# Patient Record
Sex: Male | Born: 1990 | Race: Black or African American | Hispanic: No | Marital: Single
Health system: Southern US, Community
[De-identification: ages and names within clinical notes are randomized; demographics above are authoritative.]

---
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
## 2010-01-28 IMAGING — CR DG ANKLE COMPLETE 3+V*L*
3 series · 3 of 3 positions shown · non-contrast
Comparison: None

CLINICAL DATA: Twisted ankle - pain and swelling

LEFT ANKLE COMPLETE - 3+ VIEW

[t ankle joint ap left]
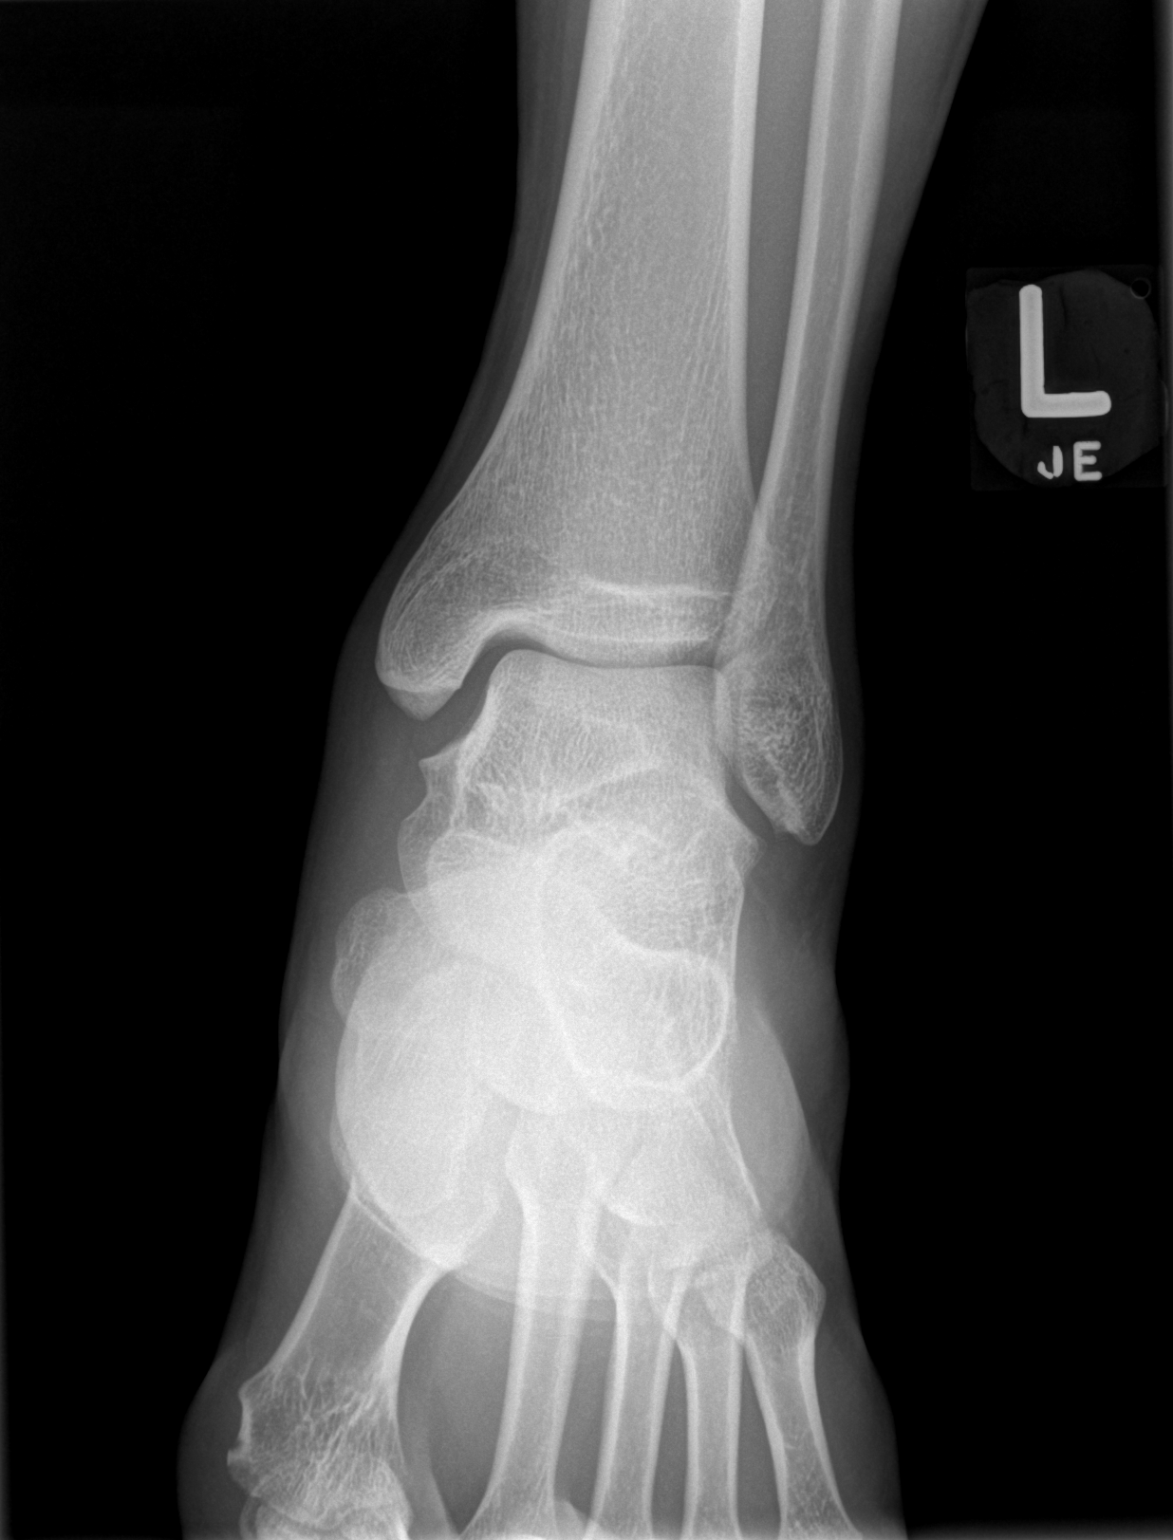

[t ankle joint oblique left]
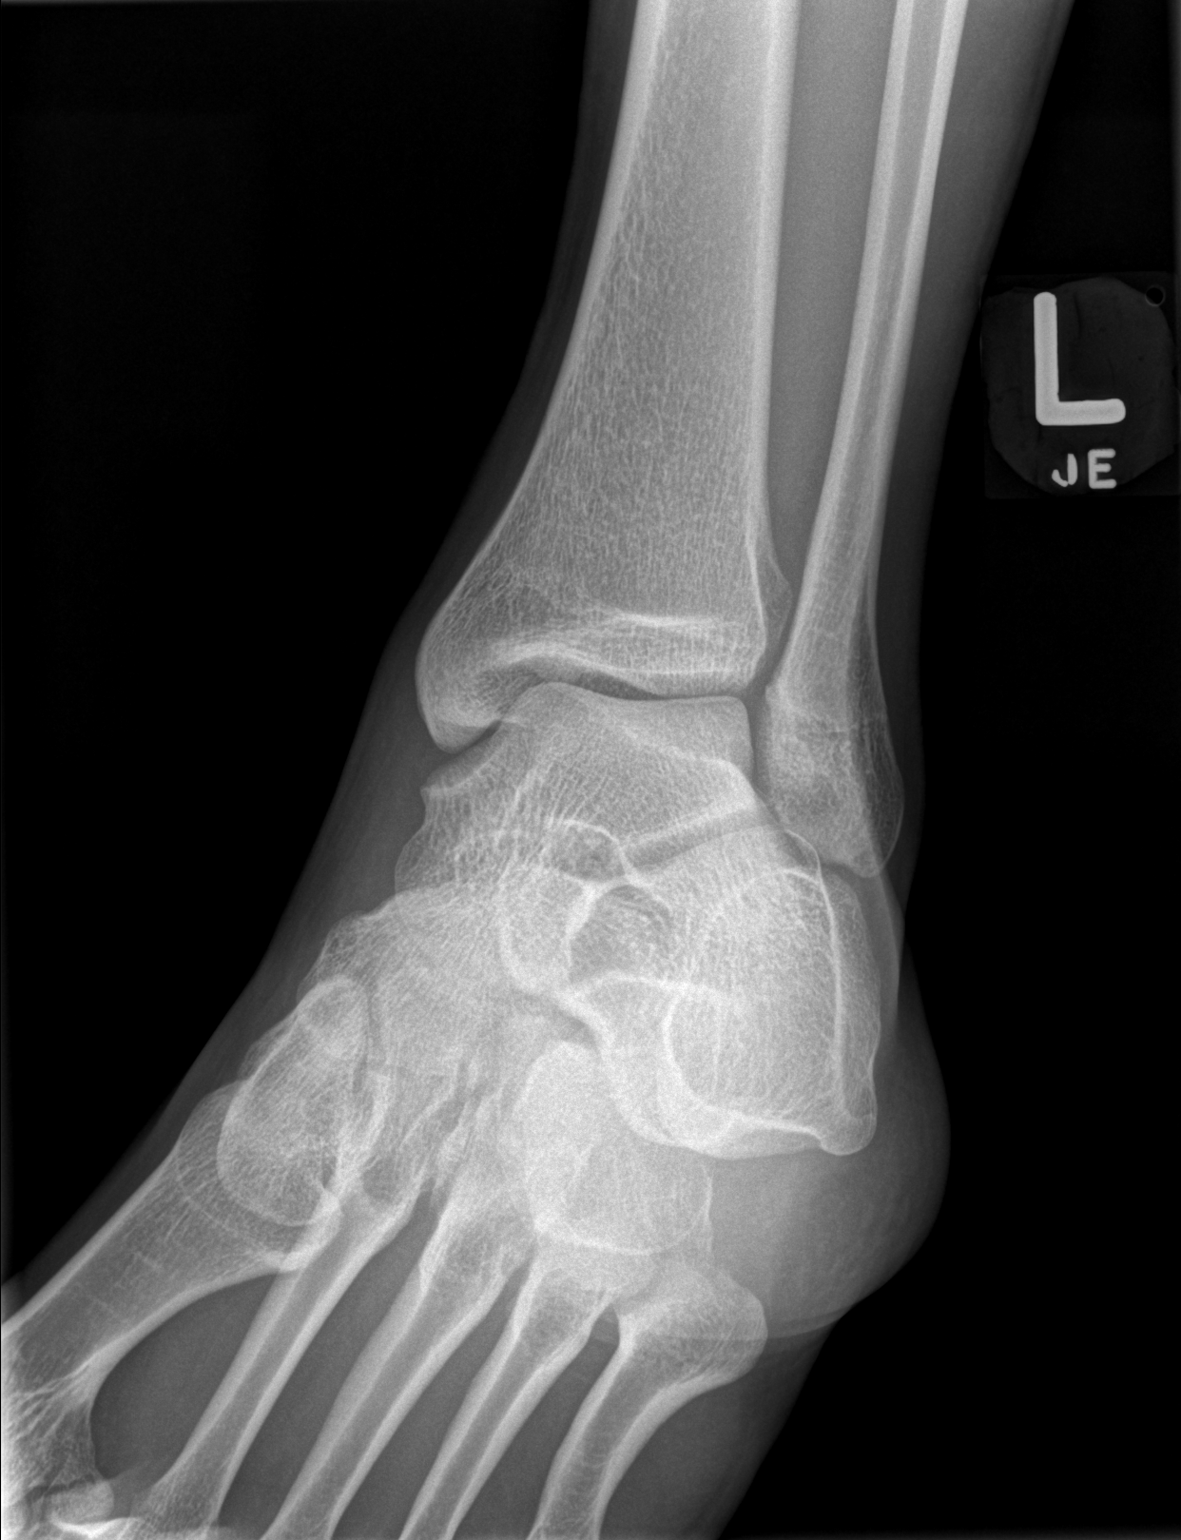

[t ankle joint lat left]
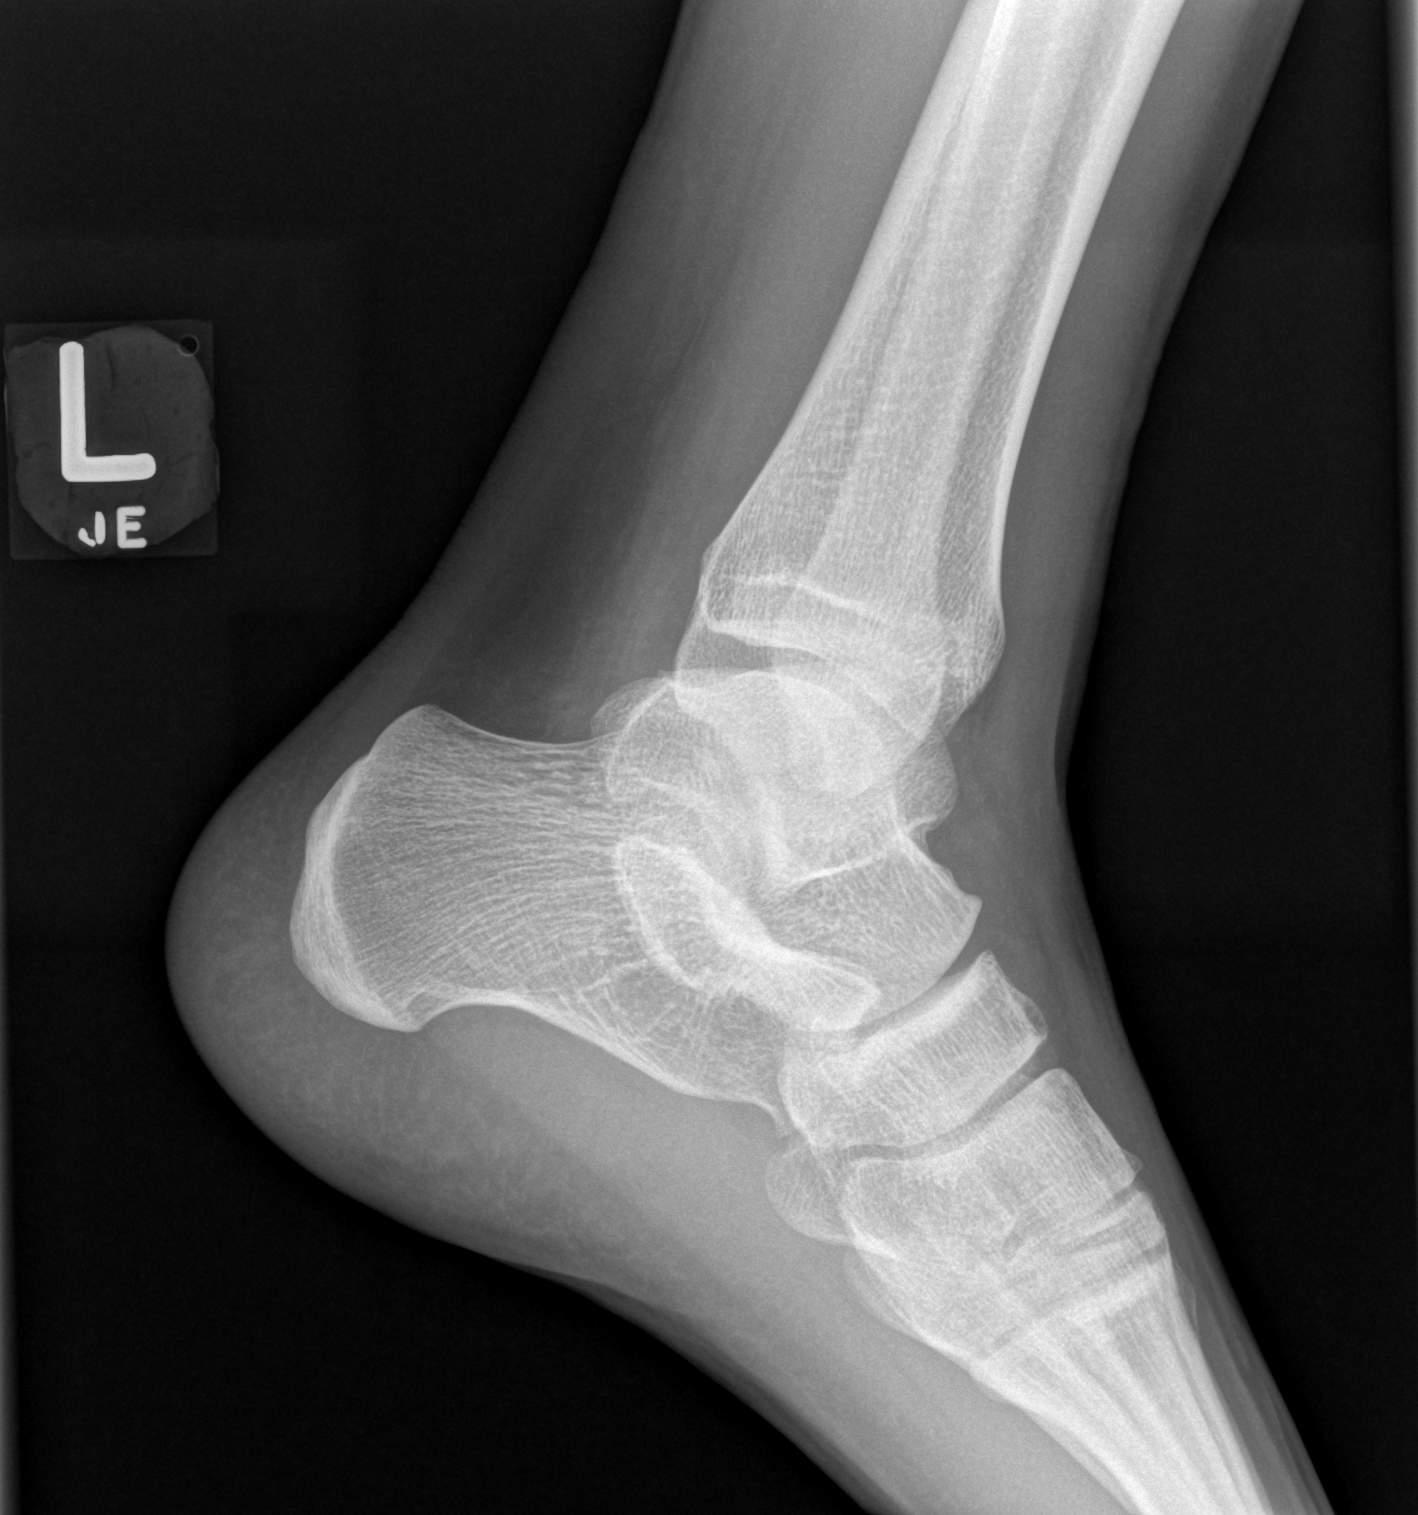

[3 of 3 positions shown; findings below may reference images not displayed]

FINDINGS: No fracture or dislocation.  No foreign body or other
abnormality of the soft tissues.
IMPRESSION: No acute or significant findings.

## 2019-02-22 ENCOUNTER — Emergency Department (HOSPITAL_COMMUNITY): Payer: HRSA Program

## 2019-02-22 ENCOUNTER — Emergency Department (HOSPITAL_COMMUNITY)
Admission: EM | Admit: 2019-02-22 | Discharge: 2019-02-22 | Disposition: A | Discharge status: Home / Self Care | Payer: HRSA Program | Attending: Emergency Medicine | Admitting: Emergency Medicine

## 2019-02-22 ENCOUNTER — Other Ambulatory Visit: Payer: Self-pay

## 2019-02-22 ENCOUNTER — Encounter (HOSPITAL_COMMUNITY): Payer: Self-pay | Admitting: Emergency Medicine

## 2019-02-22 DIAGNOSIS — J069 Acute upper respiratory infection, unspecified: Secondary | ICD-10-CM | POA: Diagnosis not present

## 2019-02-22 DIAGNOSIS — Z20828 Contact with and (suspected) exposure to other viral communicable diseases: Secondary | ICD-10-CM | POA: Diagnosis not present

## 2019-02-22 DIAGNOSIS — J029 Acute pharyngitis, unspecified: Secondary | ICD-10-CM | POA: Diagnosis present

## 2019-02-22 LAB — COMPREHENSIVE METABOLIC PANEL
ALT: 30 U/L (ref 0–44)
AST: 39 U/L (ref 15–41)
Albumin: 4.1 g/dL (ref 3.5–5.0)
Alkaline Phosphatase: 77 U/L (ref 38–126)
Anion gap: 11 (ref 5–15)
BUN: 14 mg/dL (ref 6–20)
CO2: 25 mmol/L (ref 22–32)
Calcium: 9.2 mg/dL (ref 8.9–10.3)
Chloride: 103 mmol/L (ref 98–111)
Creatinine, Ser: 1.2 mg/dL (ref 0.61–1.24)
GFR calc Af Amer: >60 mL/min (ref >60)
GFR calc non Af Amer: >60 mL/min (ref >60)
Glucose, Bld: 106 mg/dL — ABNORMAL HIGH (ref 70–99)
Potassium: 4.2 mmol/L (ref 3.5–5.1)
Sodium: 139 mmol/L (ref 135–145)
Total Bilirubin: 0.5 mg/dL (ref 0.3–1.2)
Total Protein: 7.3 g/dL (ref 6.5–8.1)

## 2019-02-22 LAB — CBC WITH DIFFERENTIAL/PLATELET
Abs Immature Granulocytes: 0.04 10*3/uL (ref 0.00–0.07)
Basophils Absolute: 0.1 10*3/uL (ref 0.0–0.1)
Basophils Relative: 1 %
Eosinophils Absolute: 0.3 10*3/uL (ref 0.0–0.5)
Eosinophils Relative: 4 %
HCT: 48.4 % (ref 39.0–52.0)
Hemoglobin: 15.3 g/dL (ref 13.0–17.0)
Immature Granulocytes: 0 %
Lymphocytes Relative: 33 %
Lymphs Abs: 3 10*3/uL (ref 0.7–4.0)
MCH: 27.3 pg (ref 26.0–34.0)
MCHC: 31.6 g/dL (ref 30.0–36.0)
MCV: 86.4 fL (ref 80.0–100.0)
Monocytes Absolute: 0.6 10*3/uL (ref 0.1–1.0)
Monocytes Relative: 7 %
Neutro Abs: 4.9 10*3/uL (ref 1.7–7.7)
Neutrophils Relative %: 55 %
Platelets: 203 10*3/uL (ref 150–400)
RBC: 5.6 MIL/uL (ref 4.22–5.81)
RDW: 13.6 % (ref 11.5–15.5)
WBC: 8.9 10*3/uL (ref 4.0–10.5)
nRBC: 0 % (ref 0.0–0.2)

## 2019-02-22 LAB — SARS CORONAVIRUS 2 BY RT PCR (HOSPITAL ORDER, PERFORMED IN ~~LOC~~ HOSPITAL LAB): SARS Coronavirus 2: NEGATIVE

## 2019-02-22 NOTE — ED Provider Notes (Signed)
Derrick Mcknight Provider Note   CSN: [id number] Arrival date & time: 02/22/19  0246    History   Chief Complaint Chief Complaint  Patient presents with  . Cough  . Nausea  . Diarrhea    HPI Derrick Mcknight is a 28 y.o. male.     Patient with a chief complaint of exposure to coronavirus.  He reports that many of his coworkers have tested positive.  He reports sore throat, congestion, and some abdominal cramps with diarrhea.  Denies any shortness of breath or fever.  States that he has had some slight cough.  Denies taking anything for the symptoms.  There are no additional aggravating or alleviating factors.  The history is provided by the patient. No language interpreter was used.    History reviewed. No pertinent past medical history.  There are no active problems to display for this patient.   History reviewed. No pertinent surgical history.      Home Medications    Prior to Admission medications   Not on File    Family History No family history on file.  Social History Social History   Tobacco Use  . Smoking status: Not on file  Substance Use Topics  . Alcohol use: Not on file  . Drug use: Not on file     Allergies   Patient has no known allergies.   Review of Systems Review of Systems  All other systems reviewed and are negative.    Physical Exam Updated Vital Signs BP 123/86   Pulse 73   Temp 98.1 F (36.7 C)   Resp 17   Ht 5\' 10"  (1.778 m)   SpO2 97%   Physical Exam Vitals signs and nursing note reviewed.  Constitutional:      Appearance: He is well-developed.  HENT:     Head: Normocephalic and atraumatic.  Eyes:     Conjunctiva/sclera: Conjunctivae normal.  Neck:     Musculoskeletal: Neck supple.  Cardiovascular:     Rate and Rhythm: Normal rate and regular rhythm.     Heart sounds: No murmur.  Pulmonary:     Effort: Pulmonary effort is normal. No respiratory distress.     Breath sounds:  Normal breath sounds.  Abdominal:     Palpations: Abdomen is soft.     Tenderness: There is no abdominal tenderness.  Musculoskeletal: Normal range of motion.  Skin:    General: Skin is warm and dry.  Neurological:     Mental Status: He is alert and oriented to person, place, and time.  Psychiatric:        Mood and Affect: Mood normal.        Behavior: Behavior normal.        Thought Content: Thought content normal.        Judgment: Judgment normal.      ED Treatments / Results  Labs (all labs ordered are listed, but only abnormal results are displayed) Labs Reviewed  COMPREHENSIVE METABOLIC PANEL - Abnormal; Notable for the following components:      Result Value   Glucose, Bld 106 (*)    All other components within normal limits  SARS CORONAVIRUS 2 (HOSPITAL ORDER, [facility])  CBC WITH DIFFERENTIAL/PLATELET    EKG None  Radiology Dg Chest Port 1 View  Result Date: 02/22/2019 CLINICAL DATA:  Cough EXAM: PORTABLE CHEST 1 VIEW COMPARISON:  None. FINDINGS: Normal heart size and mediastinal contours. No acute infiltrate or edema. No  effusion or pneumothorax. No acute osseous findings. IMPRESSION: Negative chest Electronically Signed   By: Marnee SpringJonathon  Watts M.D.   On: 02/22/2019 03:54    Procedures Procedures (including critical care time)  Medications Ordered in ED Medications - No data to display   Initial Impression / Assessment and Plan / ED Course  I have reviewed the triage vital signs and the nursing notes.  Pertinent labs & imaging results that were available during my care of the patient were reviewed by me and considered in my medical decision making (see chart for details).        [name] was evaluated in Emergency Department on 02/22/2019 for the symptoms described in the history of present illness. He was evaluated in the context of the global COVID-19 pandemic, which necessitated consideration that the patient might be  at risk for infection with the SARS-CoV-2 virus that causes COVID-19. Institutional protocols and algorithms that pertain to the evaluation of patients at risk for COVID-19 are in a state of rapid change based on information released by regulatory bodies including the CDC and federal and state organizations. These policies and algorithms were followed during the patient's care in the ED.  Pt CXR negative for acute infiltrate. Patients symptoms are consistent with URI, likely viral etiology. Discussed that antibiotics are not indicated for viral infections. Pt will be discharged with symptomatic treatment.  Verbalizes understanding and is agreeable with plan. Pt is hemodynamically stable & in NAD prior to dc.   Final Clinical Impressions(s) / ED Diagnoses   Final diagnoses:  Upper respiratory tract infection, unspecified type    ED Discharge Orders    None       Roxy HorsemanBrowning, Leotta Weingarten, PA-C 02/22/19 0530    Mesner, Barbara CowerJason, MD 02/22/19 [phone]

## 2019-02-22 NOTE — ED Triage Notes (Signed)
Pt has been having constant diarrhea and nausea with cough. Pt states his coworkers have been exposed to covid.  Alert x4. pov no fever noted. Pt has had abdominal pain lower mid stomach area.

## 2020-03-24 IMAGING — DX PORTABLE CHEST - 1 VIEW
1 series · 1 of 1 positions shown · non-contrast
Comparison: None.

CLINICAL DATA: Cough

EXAM:
PORTABLE CHEST 1 VIEW

[chest ap]
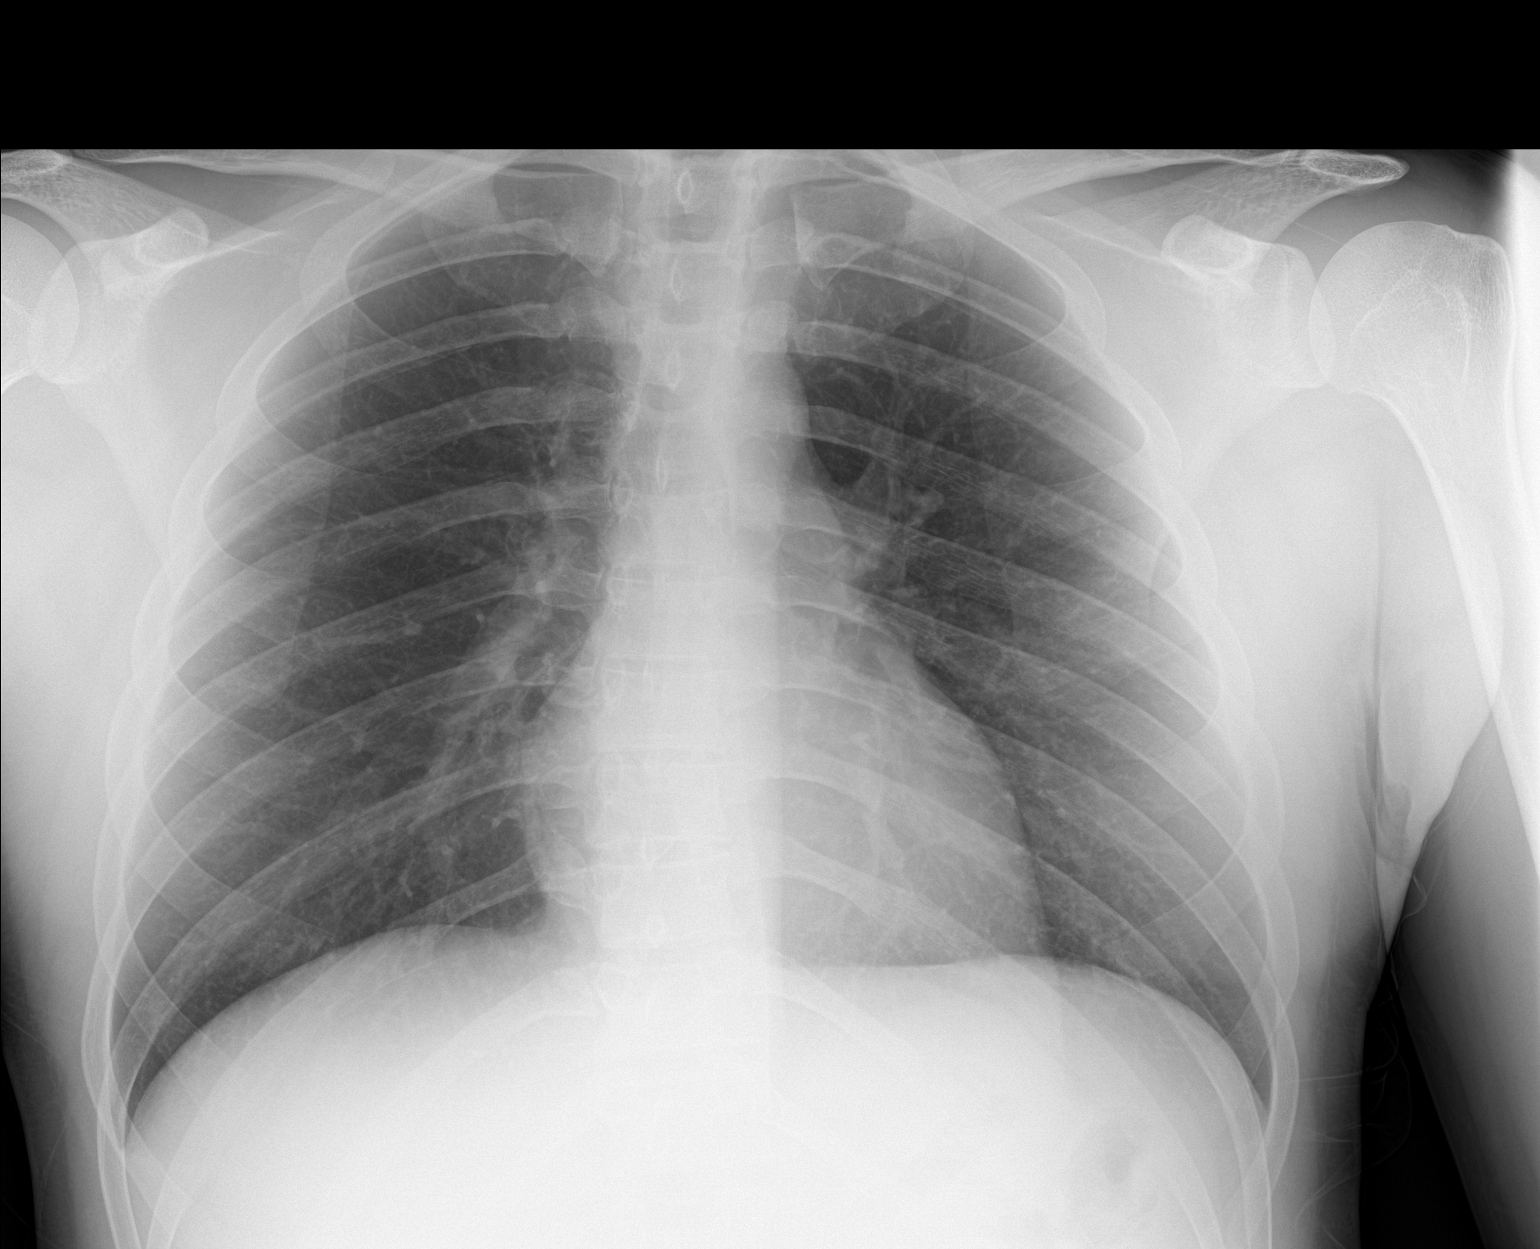

[1 of 1 positions shown; findings below may reference images not displayed]

FINDINGS: Normal heart size and mediastinal contours. No acute infiltrate or
edema. No effusion or pneumothorax. No acute osseous findings.
IMPRESSION: Negative chest
